# Patient Record
Sex: Male | Born: 1970 | Race: White | Hispanic: No | Marital: Married | Smoking: Never smoker
Health system: Southern US, Community
[De-identification: ages and names within clinical notes are randomized; demographics above are authoritative.]

## PROBLEM LIST (undated history)

## (undated) DIAGNOSIS — Z9109 Other allergy status, other than to drugs and biological substances: Secondary | ICD-10-CM

## (undated) HISTORY — PX: TONSILLECTOMY: SUR1361

---

## 2014-10-05 ENCOUNTER — Encounter (HOSPITAL_COMMUNITY): Payer: Self-pay | Admitting: Emergency Medicine

## 2014-10-05 ENCOUNTER — Emergency Department (HOSPITAL_COMMUNITY)
Admission: EM | Admit: 2014-10-05 | Discharge: 2014-10-05 | Disposition: A | Discharge status: Home / Self Care | Payer: Worker's Compensation | Attending: Emergency Medicine | Admitting: Emergency Medicine

## 2014-10-05 DIAGNOSIS — Z575 Occupational exposure to toxic agents in other industries: Secondary | ICD-10-CM | POA: Diagnosis not present

## 2014-10-05 DIAGNOSIS — Z7729 Contact with and (suspected ) exposure to other hazardous substances: Secondary | ICD-10-CM

## 2014-10-05 HISTORY — DX: Other allergy status, other than to drugs and biological substances: Z91.09

## 2014-10-05 NOTE — ED Notes (Signed)
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 Patient only complaint at this time is nausea related to the ride to the ER. Reports hx of getting car sick.

## 2014-10-05 NOTE — ED Notes (Signed)
Bed: WA25 Expected date:  Expected time:  Means of arrival:  Comments: EMS  

## 2014-10-05 NOTE — Discharge Instructions (Signed)
Please follow the directions provided.  Be sure to follow-up with your primary care provider to ensure you are getting better.  Rest, drink plenty of fluids and slowly resume normal activities.  Don't hesitate to return for any new, worsening or concerning symptoms.    SEEK IMMEDIATE MEDICAL CARE IF:  You suspect that a person has inhaled carbon monoxide gas. Remove the person from the area immediately. Call your local emergency services (911 in [place] Begin rescue breathing if the person is unconscious.   Emergency Department Resource Guide 1) Find a Doctor and Pay Out of Pocket Although you won't have to find out who is covered by your insurance plan, it is a good idea to ask around and get recommendations. You will then need to call the office and see if the doctor you have chosen will accept you as a new patient and what types of options they offer for patients who are self-pay. Some doctors offer discounts or will set up payment plans for their patients who do not have insurance, but you will need to ask so you aren't surprised when you get to your appointment.  2) Contact Your Local Health Department Not all health departments have doctors that can see patients for sick visits, but many do, so it is worth a call to see if yours does. If you don't know where your local health department is, you can check in your phone book. The CDC also has a tool to help you locate your state's health department, and many state websites also have listings of all of their local health departments.  3) Find a Walk-in Clinic If your illness is not likely to be very severe or complicated, you may want to try a walk in clinic. These are popping up all over the country in pharmacies, drugstores, and shopping centers. They're usually staffed by nurse practitioners or physician assistants that have been trained to treat common illnesses and complaints. They're usually fairly quick and inexpensive. However, if you have  serious medical issues or chronic medical problems, these are probably not your best option.  No Primary Care Doctor: - Call Health Connect at  [phone] - they can help you locate a primary care doctor that  accepts your insurance, provides certain services, etc. - Physician Referral Service- [phone]  Chronic Pain Problems: Organization         Address  Phone   Notes  [facility]  [phone] Patients need to be referred by their primary care doctor.   Medication Assistance: Organization         Address  Phone   Notes  [facility] [address] [place], [place] [address] [phone]) [phone] --Must be a resident of [place] [facility] [place] -- Must have NO insurance coverage whatsoever (no Medicaid/ Medicare, etc.) -- The pt. MUST have a primary care doctor that directs their care regularly and follows them in the community   [facility]  [phone]   [employer]  [phone]    Agencies that provide inexpensive medical care: Organization         Address  Phone   Notes  [facility]  [phone]   [facility]    [phone]   [facility] [address] [place], [place] [address] [phone]) [phone]   [facility] [address], [place] [phone]   [facility]    [phone](336) [phone]  [facility]    (336) 272-1050   °[facility] ° [address], [place] Phone:  (336) [phone], Fax:  ([phone] Hours of Operation:  9 am - 6 pm, M-F.  Also accepts Medicaid/Medicare and self-pay.  °[facility] ° [address], [place] Phone: (336) [phone], Fax: [phone]) [phone]. Hours of Operation:  8:30 am - 5:30 pm, M-F.  Also accepts Medicaid and self-pay.  °[facility] [place] [address], [place] Phone: (336) [phone]   °[facility] [address],  [place], [place] (336)723-1848, Ext. 123 Mondays & Thursdays: 7-9 AM.  First 15 patients are seen on a first come, first serve basis. °  ° °Medicaid-accepting [place] Providers: ° °Organization         Address  Phone   Notes  °[facility] [address], [place] ([phone]) [phone] Also accepts self-pay patients.  °[facility] [address], [place] ° (336) [phone]   °[facility] [address], [place] (336) [phone]   °[facility] [address] [place] [address], [place] (336) 299-7000   °Veita Bland [address], [place]  ° ([phone]) [phone] Only accepts [place] Access Medicaid patients after they have their name applied to their card.  ° °Self-Pay (no insurance) in [place]: ° °Organization         Address  Phone   Notes  °Sickle Cell Patients, [facility] [address], [place] (336) 832-1970   °[facility] [address], [place] (336) 832-4400   °[facility] ° [address], [place] (336) [phone]   °[facility]/Dr. Osei-Bonsu ° [address] [place] [address], [place] or [address], [place] ([phone]) [phone] Phone number for both [place] and [place] locations is the same.  °[facility] [address], [place] (336) [phone]   °[facility] [address] [place] [address], [place] or [address] (336) 852-7530 °(336) 878-2260   °[facility] [address],  (336) 350-1642, phone; (336) [phone], fax Sees patients 1st and 3rd Saturday of every month.  Must not qualify for public or private insurance (i.e. Medicaid, Medicare, [place] Health Choice, Veterans' Benefits) • Household income should be no more than 200% of the poverty level •The clinic cannot treat you if you are pregnant or think you are pregnant •  Sexually transmitted diseases are not treated at the clinic.  ° ° °Dental Care: °Organization         Address  Phone  Notes  °[facility] [address],  (336) [phone] Accepts children up to age 21 who are enrolled in Medicaid or [place] Health Choice; pregnant women with a Medicaid card; and children who have applied for Medicaid or [place] Health Choice, but were declined, whose parents can pay a reduced fee at time of service.  °[facility] [place]  [address], [place] (336) 641-7733 Accepts children up to age 21 who are enrolled in Medicaid or [place] Health Choice; pregnant women with a Medicaid card; and children who have applied for Medicaid or [place] Health Choice, but were declined, whose parents can pay a reduced fee at time   of service.  °[facility] ° [address], [place] (336) 641-4533 Patients are seen by appointment only. Walk-ins are not accepted. [facility] will see patients 18 years of age and older. °Monday - Tuesday (8am-5pm) °Most Wednesdays (8:30-5pm) °$30 per visit, cash only  °[facility] ° [address], [place] (336) [phone] Patients are seen by appointment only. Walk-ins are not accepted. [facility] will see patients 18 years of age and older. °One Wednesday Evening (Monthly: Volunteer Based).  $30 per visit, cash only  °[facility]  (919) [phone] for adults; Children under age 4, call Graduate Pediatric Dentistry at ([phone]) [phone]. Children aged 4-14, please call [phone]) [phone] to request a pediatric application. ° Dental services are provided in all areas of dental care including fillings, crowns and bridges, complete and partial dentures, implants, gum treatment, root canals, and extractions. Preventive care is also provided. Treatment is provided to both adults and children. °Patients  are selected via a lottery and there is often a waiting list. °  °[facility] [address], °[place] ° (336) 763-8833 [email or web address] °  °[facility] [address], [place], [place] (336)723-1848, Ext. 123 Second and Fourth Thursday of each month, opens at 6:30 AM; Clinic ends at 9 AM.  Patients are seen on a first-come first-served basis, and a limited number are seen during each clinic.  ° °[facility] ° [address], [place], [place] (336) 723-7904   Eligibility Requirements °You must have lived in [place], [place], or [place] counties for at least the last three months. °  You cannot be eligible for state or federal sponsored healthcare insurance, including Veterans Administration, Medicaid, or Medicare. °  You generally cannot be eligible for healthcare insurance through your employer.  °  How to apply: °Eligibility screenings are held every Tuesday and Wednesday afternoon from 1:00 pm until 4:00 pm. You do not need an appointment for the interview!  °[facility] [address], [place], [place] 336-631-2330   °[facility]  336-342-8273   °[facility]  336-703-3100   °[facility]  336-570-6415   ° °Behavioral Health Resources in the Community: °Intensive Outpatient Programs °Organization         Address  Phone  Notes  °[facility] [address], [place], [place] 336-878-6098   °[facility] [address], [place], [place] 336-832-9800   °[facility] [address], [place], [place] ° 336-882-2125   °[facility] [address],  °[place], [place] 1-800-853-5163 or 336-641-4981   °Substance Abuse Resources °Organization         Address  Phone  Notes  °[facility]  336-882-2125   °[facility]  336-784-9470   °[facility]  336-285-9073   °[facility]  336-845-3988    °Residential & Outpatient Substance Abuse Program  1-800-659-3381   °Psychological Services °Organization         Address  Phone  Notes  °[facility]  336- 832-9600   °[facility]  336- 378-7881   °[facility] [address], [place] 1-800-853-5163 or 336-641-4981   ° °Mobile Crisis Teams °Organization         Address  Phone  Notes  °[facility], Mobile Crisis Care Unit  1-877-626-1772   °Assertive °Psychotherapeutic   Services ° [address] [place], [place] 336-834-9664   °Sharon DeEsch [address] °[place] 336-554-5454   ° °Self-Help/Support Groups °Organization         Address  Phone             Notes  °[facility] [place] [facility] - variety of support groups  336- 373-1402 Call for more information  °Narcotics Anonymous (NA), [facility] [address], °[place]  2 meetings at this location  ° °Residential Treatment Programs °Organization         Address  Phone  Notes  °[facility] [address],    °[place]  1-866-801-8205   °[facility] ° [address], [place], [place] 704-293-8524   °[facility] [address], [place] 336-845-3988 Admissions: 8am-3pm M-F  °[facility] [address]    °[place], [place] 336-841-1104   °[facility] [address], [place], [place] 336-379-7146   °[facility] [address]  °[place], [place] 336-285-9073   °[facility] - Intensive Outpatient [address], [place], [place] 336-852-3033   °[facility] [address]  °[place], [place] 1-877-615-2722 or 336-784-9470   °[facility] ([facility]) [address] [place], [place] 336-227-7417 Accepts Medicaid  °[facility] [address]  ° [place] 1-800-659-3381 Substance Abuse/Addiction Treatment  ° °[place] [facility]  Resources °Organization         Address  Phone  Notes  °[facility]  (888) 581-9988   °Julie Brannon, PhD [address] [place], [place]   (336) 349-5553 or (336) 951-0000   °[facility]   [address] °[place], [place] (336) 349-4454   °[facility] [address], [place], [place] (336) 342-8316 Insurance/Medicaid/sponsorship through [facility]  °[facility] [address]                                    [place], [place] (336) 342-8316 Therapy/tele-psych/case  °[facility] [address]  ° [place], [place] (336) 349-2233    °Dr. Arfeen  (336) 349-4544   °[facility] [place]  [employer] [place] [facility] 1) [address], [place] °2) [address], [place] °3)  [address], [place] (336) 349-3220 °(336) 342-7768 ° °(336) 342-8140   °[place] Child Abuse [facility] (336) 342-1394 or (336) 342-3537 (After Hours)    ° ° ° °

## 2014-10-05 NOTE — ED Notes (Signed)
Per EMS, [place] airport[place] Another co worker started feeling sick, which made other aware of the exposure. Patient is asymptomatic, no complaints. Patient CO2 initially 3, now 0 after being placed on NRB while en route.

## 2014-10-05 NOTE — ED Provider Notes (Signed)
CSN: [id number]     Arrival date & time 10/05/14  0423 History   First MD Initiated Contact with Patient 10/05/14 0423     Chief Complaint  Patient presents with  . natural gas exposure    (Consider location/radiation/quality/duration/timing/severity/associated sxs/prior Treatment) HPI  Russell Berg is a 44 yo male male presenting with report of exposure to carbon monoxide. [place] and states appr 3 hours PTA he heard alarms going off but never had any symptoms. He was told that the carbon monoxide monitors were alarming and everyone was evacuated. He was brought in to be checked but never had any symptoms and feels at his baseline. He denies any headache, vomiting, shortness of breath, syncope or visual changes.   Past Medical History  Diagnosis Date  . Environmental allergies    Past Surgical History  Procedure Laterality Date  . Tonsillectomy     History reviewed. No pertinent family history. History  Substance Use Topics  . Smoking status: Never Smoker   . Smokeless tobacco: Not on file  . Alcohol Use: Yes     Comment: social    Review of Systems  Constitutional: Negative for fever and chills.  HENT: Negative for sore throat.   Eyes: Negative for visual disturbance.  Respiratory: Negative for cough and shortness of breath.   Cardiovascular: Negative for chest pain and leg swelling.  Gastrointestinal: Negative for nausea, vomiting and diarrhea.  Genitourinary: Negative for dysuria.  Musculoskeletal: Negative for myalgias.  Skin: Negative for rash.  Neurological: Negative for weakness, numbness and headaches.      Allergies  Review of patient's allergies indicates no known allergies.  Home Medications   Prior to Admission medications   Not on File   BP 131/90 mmHg  Pulse 70  Temp(Src) 97.7 F (36.5 C) (Oral)  Resp 16  SpO2 98% Physical Exam  Constitutional: He is oriented to person, place, and time. He appears well-developed and  well-nourished. No distress.  HENT:  Head: Normocephalic and atraumatic.  Mouth/Throat: Oropharynx is clear and moist.  Eyes: Conjunctivae are normal. Right eye exhibits no discharge. Left eye exhibits no discharge. No scleral icterus.  Neck: Normal range of motion.  Cardiovascular: Normal rate, regular rhythm and intact distal pulses.   Pulmonary/Chest: Effort normal and breath sounds normal. No respiratory distress. He has no wheezes. He has no rales. He exhibits no tenderness.  Abdominal: Soft. There is no tenderness.  Musculoskeletal: He exhibits no tenderness.  Lymphadenopathy:    He has no cervical adenopathy.  Neurological: He is alert and oriented to person, place, and time. No cranial nerve deficit. Coordination normal.  Skin: Skin is warm and dry. No rash noted. He is not diaphoretic.  Psychiatric: He has a normal mood and affect.  Nursing note and vitals reviewed.   ED Course  Procedures (including critical care time) Labs Review Labs Reviewed - No data to display  Imaging Review No results found.   EKG Interpretation None      MDM   Final diagnoses:  Carbon monoxide exposure   44 yo with exposure to carbon monoxide with no symptoms at anytime.  His highest CO reading en route was 3 with the most recent reading was 0 after O2 administration.  Discussed case with Dr. Ranae PalmsYelverton. Pt is well-appearing, in no acute distress and vital signs reviewed and not concerning. He appears safe to be discharged.  Discharge include follow-up with their PCP.  Return precautions provided.  Pt aware of plan  and in agreement.    Filed Vitals:   10/05/14 0423 10/05/14 0426  BP:  131/90  Pulse:  70  Temp:  97.7 F (36.5 C)  TempSrc:  Oral  Resp:  16  SpO2: 100% 98%    Meds given in ED:  Medications - No data to display  New Prescriptions   No medications on file       Harle BattiestElizabeth Annissa Andreoni, NP 10/05/14 1738  Loren Raceravid Yelverton, MD 10/06/14 [phone]

## 2015-02-22 ENCOUNTER — Encounter (HOSPITAL_COMMUNITY): Payer: Self-pay | Admitting: *Deleted

## 2015-02-22 ENCOUNTER — Emergency Department (HOSPITAL_COMMUNITY)
Admission: EM | Admit: 2015-02-22 | Discharge: 2015-02-22 | Disposition: A | Discharge status: Home / Self Care | Payer: Managed Care, Other (non HMO) | Attending: Emergency Medicine | Admitting: Emergency Medicine

## 2015-02-22 ENCOUNTER — Emergency Department (HOSPITAL_COMMUNITY): Payer: Managed Care, Other (non HMO)

## 2015-02-22 DIAGNOSIS — Z79899 Other long term (current) drug therapy: Secondary | ICD-10-CM | POA: Diagnosis not present

## 2015-02-22 DIAGNOSIS — R110 Nausea: Secondary | ICD-10-CM | POA: Insufficient documentation

## 2015-02-22 DIAGNOSIS — Z791 Long term (current) use of non-steroidal anti-inflammatories (NSAID): Secondary | ICD-10-CM | POA: Insufficient documentation

## 2015-02-22 DIAGNOSIS — R42 Dizziness and giddiness: Secondary | ICD-10-CM | POA: Insufficient documentation

## 2015-02-22 DIAGNOSIS — R51 Headache: Secondary | ICD-10-CM | POA: Insufficient documentation

## 2015-02-22 LAB — BASIC METABOLIC PANEL
Anion gap: 7 (ref 5–15)
BUN: 13 mg/dL (ref 6–20)
CALCIUM: 8.9 mg/dL (ref 8.9–10.3)
CO2: 25 mmol/L (ref 22–32)
CREATININE: 1.02 mg/dL (ref 0.61–1.24)
Chloride: 104 mmol/L (ref 101–111)
GFR calc Af Amer: >60 mL/min (ref >60)
GFR calc non Af Amer: >60 mL/min (ref >60)
GLUCOSE: 164 mg/dL — AB (ref 65–99)
Potassium: 4 mmol/L (ref 3.5–5.1)
Sodium: 136 mmol/L (ref 135–145)

## 2015-02-22 LAB — CBC WITH DIFFERENTIAL/PLATELET
Basophils Absolute: 0 10*3/uL (ref 0.0–0.1)
Basophils Relative: 0 %
EOS PCT: 0 %
Eosinophils Absolute: 0.1 10*3/uL (ref 0.0–0.7)
HEMATOCRIT: 41.8 % (ref 39.0–52.0)
Hemoglobin: 14.3 g/dL (ref 13.0–17.0)
LYMPHS PCT: 8 %
Lymphs Abs: 1.3 10*3/uL (ref 0.7–4.0)
MCH: 30.3 pg (ref 26.0–34.0)
MCHC: 34.2 g/dL (ref 30.0–36.0)
MCV: 88.6 fL (ref 78.0–100.0)
MONO ABS: 0.7 10*3/uL (ref 0.1–1.0)
MONOS PCT: 5 %
NEUTROS ABS: 13.5 10*3/uL — AB (ref 1.7–7.7)
Neutrophils Relative %: 87 %
PLATELETS: 251 10*3/uL (ref 150–400)
RBC: 4.72 MIL/uL (ref 4.22–5.81)
RDW: 12.2 % (ref 11.5–15.5)
WBC: 15.6 10*3/uL — ABNORMAL HIGH (ref 4.0–10.5)

## 2015-02-22 MED ORDER — ONDANSETRON 4 MG PO TBDP
4.0000 mg | ORAL_TABLET | Freq: Once | ORAL | Status: AC
  Administered 2015-02-22: 4 mg via ORAL
  Filled 2015-02-22: qty 1

## 2015-02-22 MED ORDER — MECLIZINE HCL 25 MG PO TABS: 25.0000 mg | ORAL_TABLET | Freq: Three times a day (TID) | ORAL | Status: AC | PRN

## 2015-02-22 MED ORDER — MECLIZINE HCL 25 MG PO TABS
25.0000 mg | ORAL_TABLET | Freq: Once | ORAL | Status: AC
  Administered 2015-02-22: 25 mg via ORAL
  Filled 2015-02-22: qty 1

## 2015-02-22 MED ORDER — PROMETHAZINE HCL 25 MG PO TABS: 25.0000 mg | ORAL_TABLET | Freq: Four times a day (QID) | ORAL | Status: AC | PRN

## 2015-02-22 NOTE — ED Notes (Signed)
Called CT for an update, since it's been about an hour since teasts were ordered. Stated that they would try to fit him in within the next few minutes.  

## 2015-02-22 NOTE — ED Notes (Signed)
Pt to ED via [facility] c/o dizziness associated with nausea. Dizziness worsens with head movements.  VS bp 113/82; pulse 64; 95%

## 2015-02-22 NOTE — Discharge Instructions (Signed)
Benign Positional Vertigo °Vertigo is the feeling that you or your surroundings are moving when they are not. Benign positional vertigo is the most common form of vertigo. The cause of this condition is not serious (is benign). This condition is triggered by certain movements and positions (is positional). This condition can be dangerous if it occurs while you are doing something that could endanger you or others, such as driving.  °CAUSES °In many cases, the cause of this condition is not known. It may be caused by a disturbance in an area of the inner ear that helps your brain to sense movement and balance. This disturbance can be caused by a viral infection (labyrinthitis), head injury, or repetitive motion. °RISK FACTORS °This condition is more likely to develop in: °· Women. °· People who are 50 years of age or older. °SYMPTOMS °Symptoms of this condition usually happen when you move your head or your eyes in different directions. Symptoms may start suddenly, and they usually last for less than a minute. Symptoms may include: °· Loss of balance and falling. °· Feeling like you are spinning or moving. °· Feeling like your surroundings are spinning or moving. °· Nausea and vomiting. °· Blurred vision. °· Dizziness. °· Involuntary eye movement (nystagmus). °Symptoms can be mild and cause only slight annoyance, or they can be severe and interfere with daily life. Episodes of benign positional vertigo may return (recur) over time, and they may be triggered by certain movements. Symptoms may improve over time. °DIAGNOSIS °This condition is usually diagnosed by medical history and a physical exam of the head, neck, and ears. You may be referred to a health care provider who specializes in ear, nose, and throat (ENT) problems (otolaryngologist) or a provider who specializes in disorders of the nervous system (neurologist). You may have additional testing, including: °· MRI. °· A CT scan. °· Eye movement tests. Your  health care provider may ask you to change positions quickly while he or she watches you for symptoms of benign positional vertigo, such as nystagmus. Eye movement may be tested with an electronystagmogram (ENG), caloric stimulation, the Dix-Hallpike test, or the roll test. °· An electroencephalogram (EEG). This records electrical activity in your brain. °· Hearing tests. °TREATMENT °Usually, your health care provider will treat this by moving your head in specific positions to adjust your inner ear back to normal. Surgery may be needed in severe cases, but this is rare. In some cases, benign positional vertigo may resolve on its own in 2-4 weeks. °HOME CARE INSTRUCTIONS °Safety °· Move slowly. Avoid sudden body or head movements. °· Avoid driving. °· Avoid operating heavy machinery. °· Avoid doing any tasks that would be dangerous to you or others if a vertigo episode would occur. °· If you have trouble walking or keeping your balance, try using a cane for stability. If you feel dizzy or unstable, sit down right away. °· Return to your normal activities as told by your health care provider. Ask your health care provider what activities are safe for you. °General Instructions °· Take over-the-counter and prescription medicines only as told by your health care provider. °· Avoid certain positions or movements as told by your health care provider. °· Drink enough fluid to keep your urine clear or pale yellow. °· Keep all follow-up visits as told by your health care provider. This is important. °SEEK MEDICAL CARE IF: °· You have a fever. °· Your condition gets worse or you develop new symptoms. °· Your family or friends   notice any behavioral changes. °· Your nausea or vomiting gets worse. °· You have numbness or a "pins and needles" sensation. °SEEK IMMEDIATE MEDICAL CARE IF: °· You have difficulty speaking or moving. °· You are always dizzy. °· You faint. °· You develop severe headaches. °· You have weakness in your  legs or arms. °· You have changes in your hearing or vision. °· You develop a stiff neck. °· You develop sensitivity to light. °  °This information is not intended to replace advice given to you by your health care provider. Make sure you discuss any questions you have with your health care provider. °  °Document Released: 02/10/2006 Document Revised: 01/24/2015 Document Reviewed: 08/28/2014 °Elsevier Interactive Patient Education ©2016 [employer] ° °Dizziness °Dizziness is a common problem. It makes you feel unsteady or lightheaded. You may feel like you are about to pass out (faint). Dizziness can lead to injury if you stumble or fall. Anyone can get dizzy, but dizziness is more common in older adults. This condition can be caused by a number of things, including: °· Medicines. °· Dehydration. °· Illness. °HOME CARE °Following these instructions may help with your condition: °Eating and Drinking °· Drink enough fluid to keep your pee (urine) clear or pale yellow. This helps to keep you from getting dehydrated. Try to drink more clear fluids, such as water. °· Do not drink alcohol. °· Limit how much caffeine you drink or eat if told by your doctor. °· Limit how much salt you drink or eat if told by your doctor. °Activity °· Avoid making quick movements. °¨ When you stand up from sitting in a chair, steady yourself until you feel okay. °¨ In the morning, first sit up on the side of the bed. When you feel okay, stand slowly while you hold onto something. Do this until you know that your balance is fine. °· Move your legs often if you need to stand in one place for a long time. Tighten and relax your muscles in your legs while you are standing. °· Do not drive or use heavy machinery if you feel dizzy. °· Avoid bending down if you feel dizzy. Place items in your home so that they are easy for you to reach without leaning over. °Lifestyle °· Do not use any tobacco products, including cigarettes, chewing tobacco, or  electronic cigarettes. If you need help quitting, ask your doctor. °· Try to lower your stress level, such as with yoga or meditation. Talk with your doctor if you need help. °General Instructions °· Watch your dizziness for any changes. °· Take medicines only as told by your doctor. Talk with your doctor if you think that your dizziness is caused by a medicine that you are taking. °· Tell a friend or a family member that you are feeling dizzy. If he or she notices any changes in your behavior, have this person call your doctor. °· Keep all follow-up visits as told by your doctor. This is important. °GET HELP IF: °· Your dizziness does not go away. °· Your dizziness or light-headedness gets worse. °· You feel sick to your stomach (nauseous). °· You have trouble hearing. °· You have new symptoms. °· You are unsteady on your feet or you feel like the room is spinning. °GET HELP RIGHT AWAY IF: °· You throw up (vomit) or have diarrhea and are unable to eat or drink anything. °· You have trouble: °¨ Talking. °¨ Walking. °¨ Swallowing. °¨ Using your arms, hands, or legs. °·   You feel generally weak.  You are not thinking clearly or you have trouble forming sentences. It may take a friend or family member to notice this.  You have:  Chest pain.  Pain in your belly (abdomen).  Shortness of breath.  Sweating.  Your vision changes.  You are bleeding.  You have a headache.  You have neck pain or a stiff neck.  You have a fever.   This information is not intended to replace advice given to you by your health care provider. Make sure you discuss any questions you have with your health care provider.   Document Released: 04/24/2011 Document Revised: 09/19/2014 Document Reviewed: 05/01/2014 Elsevier Interactive Patient Education [employer]  Medications as directed. Phenergan is for nausea and vomiting. The anti-Burtis for the dizziness. Follow-up if it does not resolve over the next several  days. Work note provided. Return for any new or worse symptoms.

## 2015-02-22 NOTE — ED Notes (Signed)
Pt c/o onset of dizziness tonight. Reports having seasonal allergies in which pt takes allegra; pt reports not taking allegra in over a week. Dizziness worsens with movement. No neuro deficits

## 2015-02-22 NOTE — ED Notes (Signed)
MD at bedside. 

## 2015-02-22 NOTE — ED Notes (Signed)
Called CT for an update, since it's been about an hour since teasts were ordered. Stated that they would try to fit him in within the next few minutes.

## 2015-02-22 NOTE — ED Notes (Signed)
Pt transported to CT ?

## 2015-02-22 NOTE — ED Provider Notes (Signed)
CSN: [id number]     Arrival date & time 02/22/15  0559 History   First MD Initiated Contact with Patient 02/22/15 [phone]     Chief Complaint  Patient presents with  . Dizziness     (Consider location/radiation/quality/duration/timing/severity/associated sxs/prior Treatment) Patient is a 44 y.o. male presenting with dizziness. The history is provided by the patient.  Dizziness Associated symptoms: nausea   Associated symptoms: no shortness of breath    patient with acute onset of dizziness and vertigo with for new morning. Made worse with movement of his head. Patient states that he has a history of seasonal allergies and sometimes congestion is caused room spinning in the past. Patient felt fine all day yesterday and when he went to bed. Patient denies any visual changes any focal deficits. Any significant headache. Associated with nausea but no vomiting.  Past Medical History  Diagnosis Date  . Environmental allergies    Past Surgical History  Procedure Laterality Date  . Tonsillectomy     History reviewed. No pertinent family history. Social History  Substance Use Topics  . Smoking status: Never Smoker   . Smokeless tobacco: None  . Alcohol Use: Yes     Comment: social    Review of Systems  Constitutional: Negative for fever.  HENT: Negative for congestion.   Eyes: Negative for visual disturbance.  Respiratory: Negative for shortness of breath.   Gastrointestinal: Positive for nausea.  Genitourinary: Negative for dysuria.  Musculoskeletal: Negative for back pain and neck pain.  Neurological: Positive for dizziness.  Hematological: Does not bruise/bleed easily.  Psychiatric/Behavioral: Negative for confusion.      Allergies  Review of patient's allergies indicates no known allergies.  Home Medications   Prior to Admission medications   Medication Sig Start Date End Date Taking? Authorizing Provider  fexofenadine-pseudoephedrine (ALLEGRA-D) 60-120 MG 12 hr tablet  Take 1 tablet by mouth 2 (two) times daily.   Yes Historical Provider, MD  GARCINIA CAMBOGIA-CHROMIUM PO Take 1 capsule by mouth 2 (two) times daily.   Yes Historical Provider, MD  Multiple Vitamin (MULTIVITAMIN WITH MINERALS) TABS tablet Take 1 tablet by mouth daily.   Yes Historical Provider, MD  naproxen sodium (ANAPROX) 220 MG tablet Take 440 mg by mouth every morning.   Yes Historical Provider, MD  meclizine (ANTIVERT) 25 MG tablet Take 1 tablet (25 mg total) by mouth 3 (three) times daily as needed for dizziness. 02/22/15   Vanetta Mulders, MD  promethazine (PHENERGAN) 25 MG tablet Take 1 tablet (25 mg total) by mouth every 6 (six) hours as needed for nausea or vomiting. 02/22/15   Vanetta Mulders, MD   BP 116/85 mmHg  Pulse 73  Temp(Src) 97.4 F (36.3 C) (Oral)  Resp 19  SpO2 98% Physical Exam  Constitutional: He is oriented to person, place, and time. He appears well-developed and well-nourished. No distress.  HENT:  Head: Normocephalic and atraumatic.  Mouth/Throat: Oropharynx is clear and moist.  Eyes: Conjunctivae and EOM are normal. Pupils are equal, round, and reactive to light.  Neck: Normal range of motion.  Cardiovascular: Normal rate, regular rhythm and normal heart sounds.   Pulmonary/Chest: Effort normal and breath sounds normal. No respiratory distress.  Abdominal: Soft. Bowel sounds are normal. There is no tenderness.  Musculoskeletal: Normal range of motion.  Neurological: He is alert and oriented to person, place, and time. No cranial nerve deficit. He exhibits normal muscle tone. Coordination normal.  Skin: Skin is warm. No rash noted.  Nursing note and vitals  reviewed.   ED Course  Procedures (including critical care time) Labs Review Labs Reviewed  CBC WITH DIFFERENTIAL/PLATELET - Abnormal; Notable for the following:    WBC 15.6 (*)    Neutro Abs 13.5 (*)    All other components within normal limits  BASIC METABOLIC PANEL - Abnormal; Notable for the  following:    Glucose, Bld 164 (*)    All other components within normal limits   Results for orders placed or performed during the hospital encounter of 02/22/15  CBC with Differential/Platelet  Result Value Ref Range   WBC 15.6 (H) 4.0 - 10.5 K/uL   RBC 4.72 4.22 - 5.81 MIL/uL   Hemoglobin 14.3 13.0 - 17.0 g/dL   HCT 16.1 09.6 - 04.5 %   MCV 88.6 78.0 - 100.0 fL   MCH 30.3 26.0 - 34.0 pg   MCHC 34.2 30.0 - 36.0 g/dL   RDW 40.9 81.1 - 91.4 %   Platelets 251 150 - 400 K/uL   Neutrophils Relative % 87 %   Neutro Abs 13.5 (H) 1.7 - 7.7 K/uL   Lymphocytes Relative 8 %   Lymphs Abs 1.3 0.7 - 4.0 K/uL   Monocytes Relative 5 %   Monocytes Absolute 0.7 0.1 - 1.0 K/uL   Eosinophils Relative 0 %   Eosinophils Absolute 0.1 0.0 - 0.7 K/uL   Basophils Relative 0 %   Basophils Absolute 0.0 0.0 - 0.1 K/uL  Basic metabolic panel  Result Value Ref Range   Sodium 136 135 - 145 mmol/L   Potassium 4.0 3.5 - 5.1 mmol/L   Chloride 104 101 - 111 mmol/L   CO2 25 22 - 32 mmol/L   Glucose, Bld 164 (H) 65 - 99 mg/dL   BUN 13 6 - 20 mg/dL   Creatinine, Ser 7.82 0.61 - 1.24 mg/dL   Calcium 8.9 8.9 - 95.6 mg/dL   GFR calc non Af Amer >60 >60 mL/min   GFR calc Af Amer >60 >60 mL/min   Anion gap 7 5 - 15     Imaging Review Ct Head Wo Contrast  02/22/2015   CLINICAL DATA:  Dizziness  EXAM: CT HEAD WITHOUT CONTRAST  TECHNIQUE: Contiguous axial images were obtained from the base of the skull through the vertex without intravenous contrast.  COMPARISON:  None.  FINDINGS: The ventricles are normal in size and configuration. There is no intracranial mass, hemorrhage, extra-axial fluid collection, or midline shift. The gray-white compartments are normal. No acute infarct evident. Bony calvarium appears intact. The mastoid air cells are clear.  IMPRESSION: Study within normal limits.   Electronically Signed   By: Bretta Bang III M.D.   On: 02/22/2015 10:36   I have personally reviewed and evaluated  these images and lab results as part of my medical decision-making.   EKG Interpretation   Date/Time:  Thursday February 22 2015 08:18:01 EDT Ventricular Rate:  59 PR Interval:  173 QRS Duration: 101 QT Interval:  414 QTC Calculation: 410 R Axis:   29 Text Interpretation:  Sinus rhythm RSR' in V1 or V2, right VCD or RVH No  previous ECGs available Confirmed by Matas Burrows  MD, Mahogony Gilchrest [id number]) on  02/22/2015 8:20:32 AM      MDM   Final diagnoses:  Vertigo    Patient with acute onset of vertigo symptoms of foreign the morning. Has had similar symptoms related to sinus problems in the past. Patient without any other focal neuro deficits. Do not feel that this is stroke  related. Patient received Antivert here with significant improvement. Head CT negative. MRI required if symptoms persist.  We'll continue treatment with the Antivert.    Vanetta Mulders, MD 02/22/15 1053

## 2016-07-15 IMAGING — CT CT HEAD W/O CM
2 series · 16 of 30 positions shown, 20 images · non-contrast
Comparison: None.

CLINICAL DATA: Dizziness

EXAM:
CT HEAD WITHOUT CONTRAST
TECHNIQUE: Contiguous axial images were obtained from the base of the skull
through the vertex without intravenous contrast.

[Series 201: head w/o, idose (1) · axial · non-contrast · 0.49mm/px · z∈[+1212,+1347]mm · 13 of 33 slices shown, 17 images]
[im 3/33  brain]
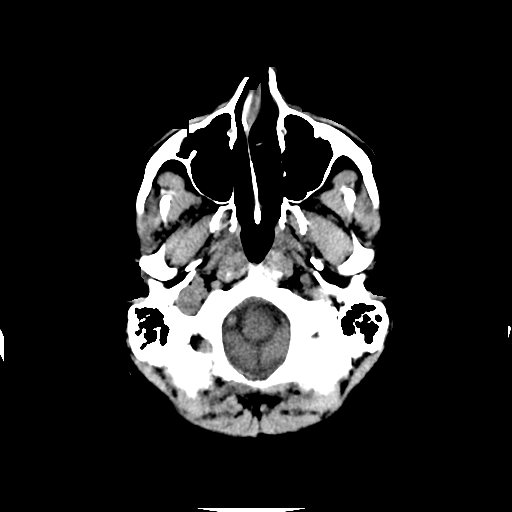
[im 3/33  bone]
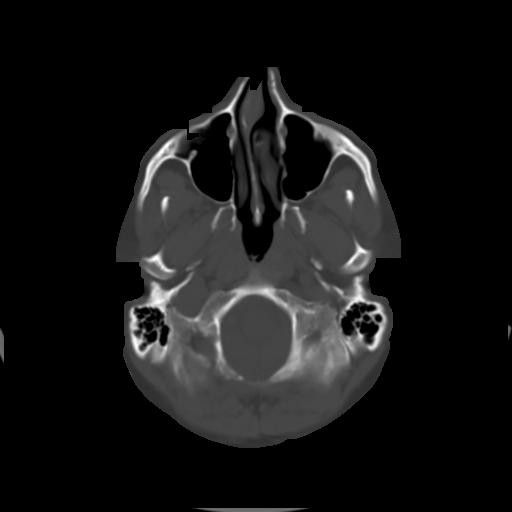
[im 5/33  brain]
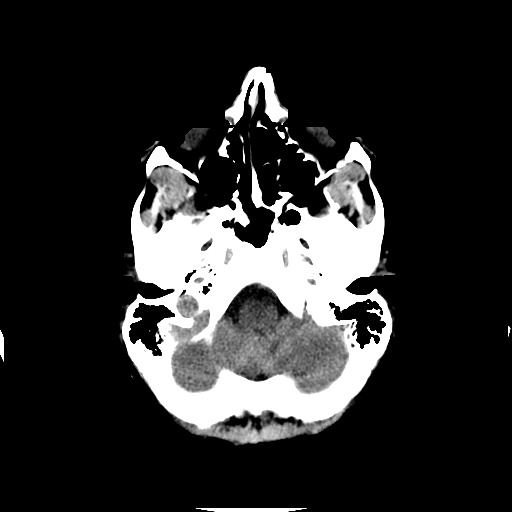
[im 7/33  brain]
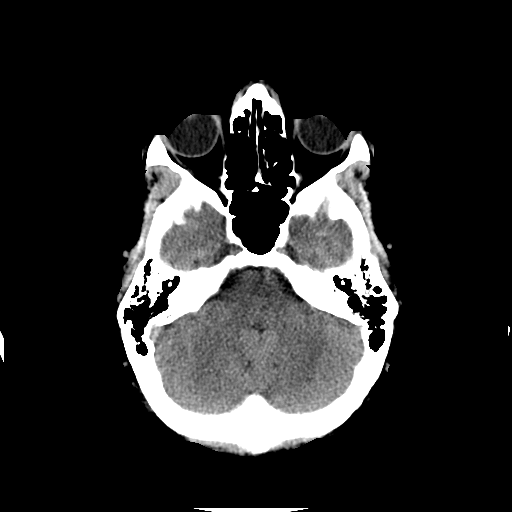
[im 10/33  brain]
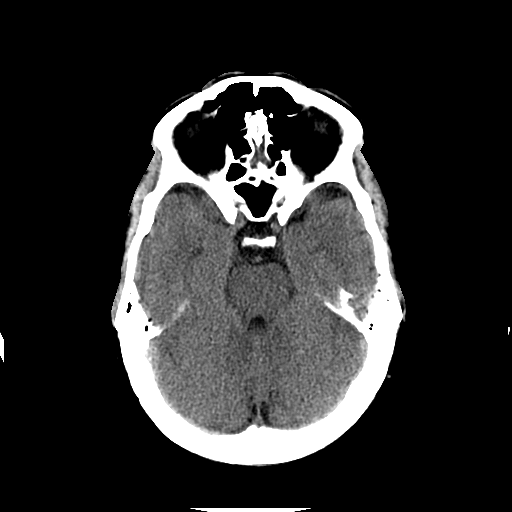
[im 12/33  brain]
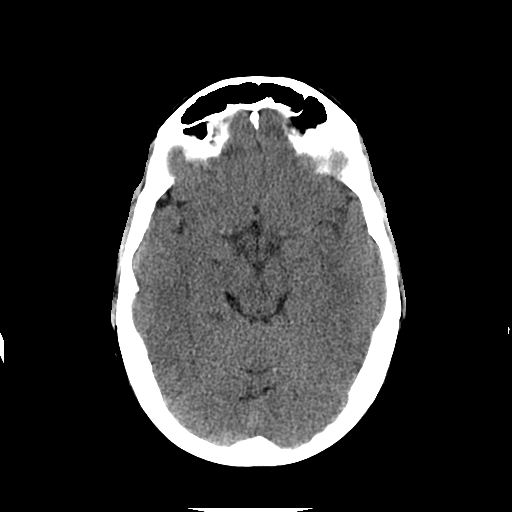
[im 12/33  bone]
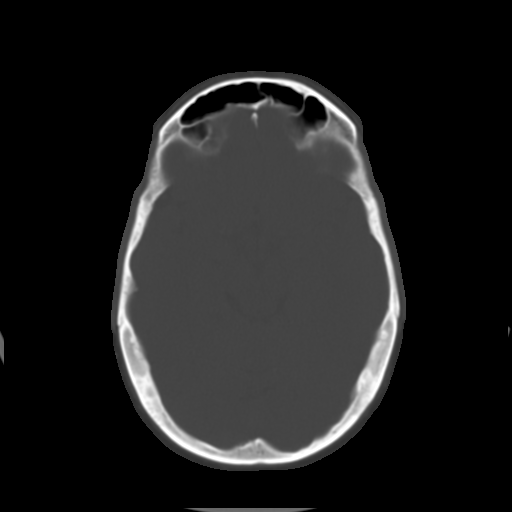
[im 14/33  brain]
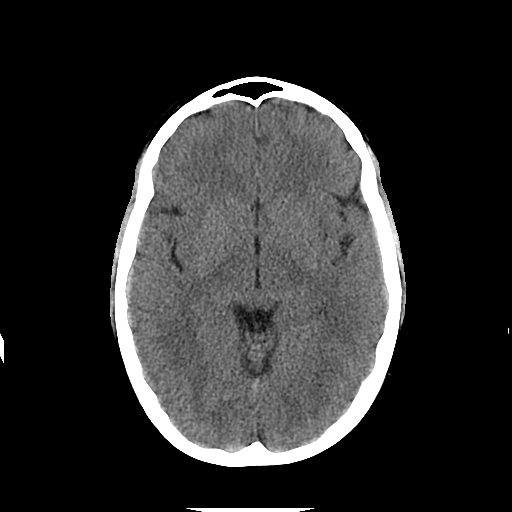
[im 17/33  brain]
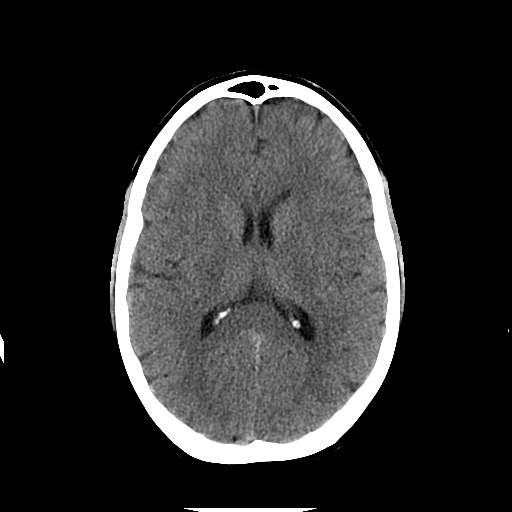
[im 19/33  brain]
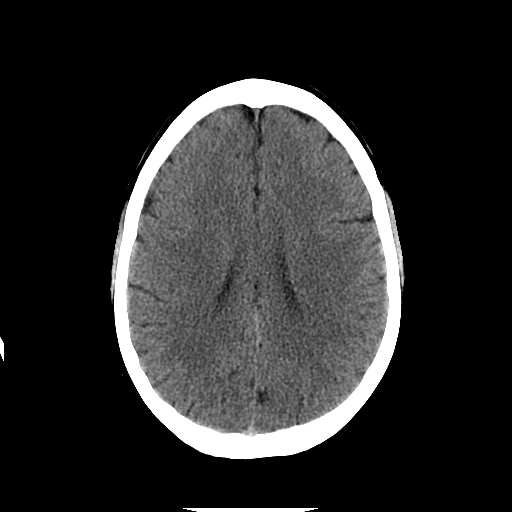
[im 21/33  brain]
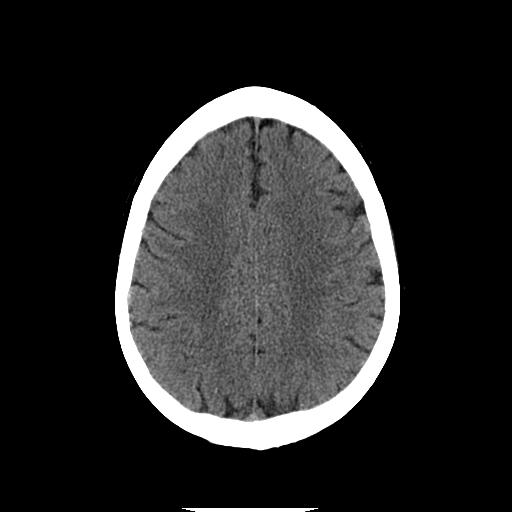
[im 21/33  bone]
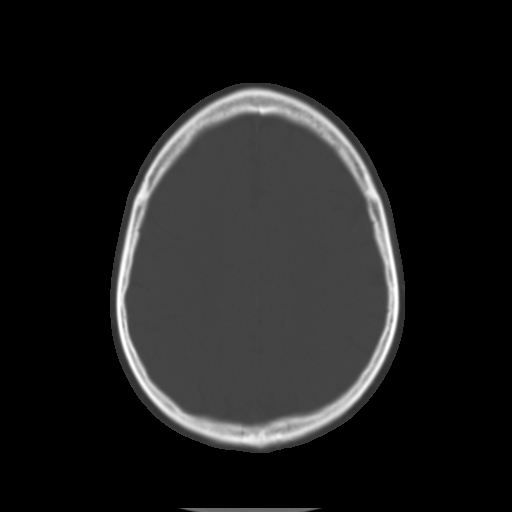
[im 23/33  brain]
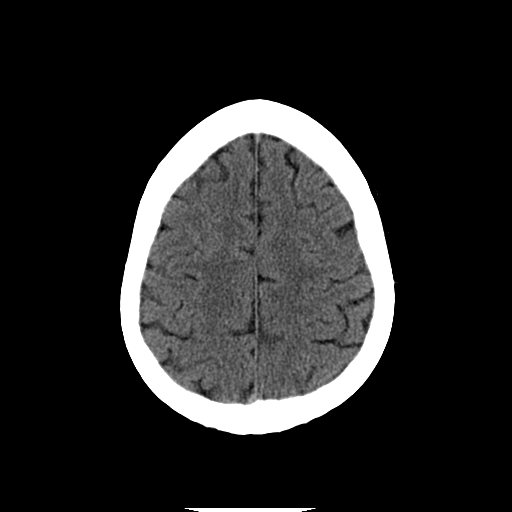
[im 26/33  brain]
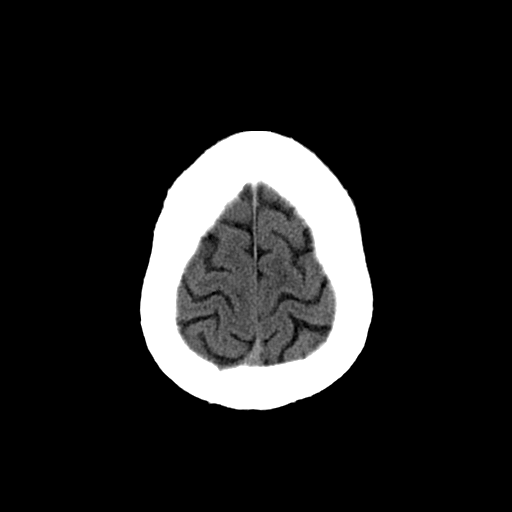
[im 28/33  brain]
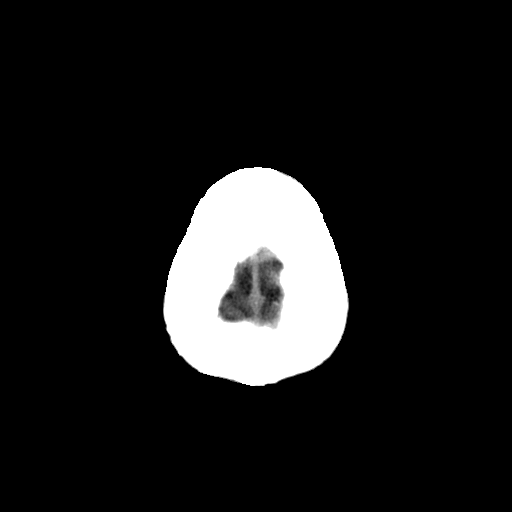
[im 30/33  brain]
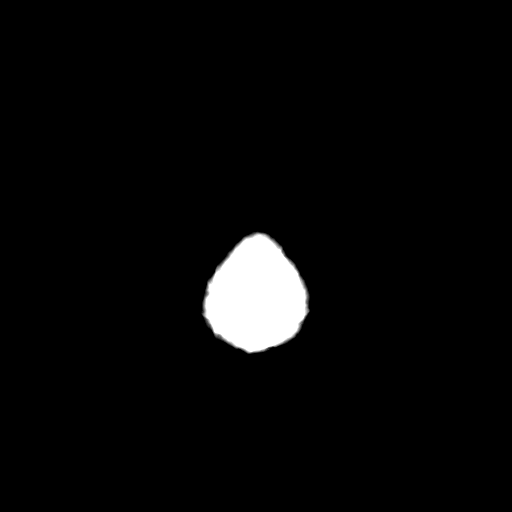
[im 30/33  bone]
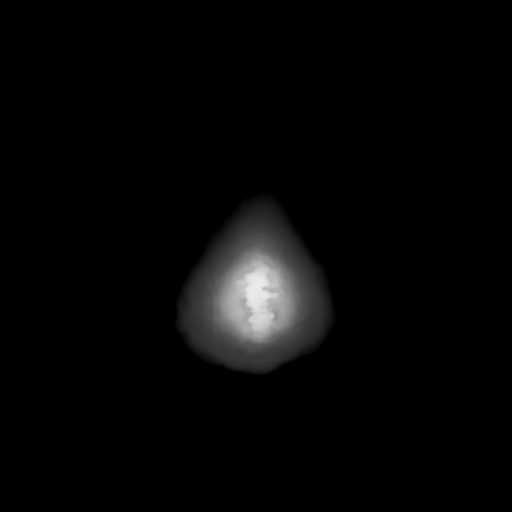

[Series 202: head w/o bone, idose (1) · axial · non-contrast · 0.49mm/px · z∈[+1212,+1257]mm · 3 of 33 slices shown]
[im 3/33  bone]
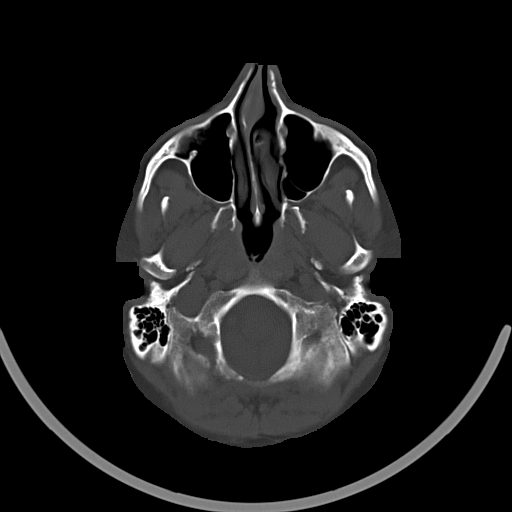
[im 7/33  bone]
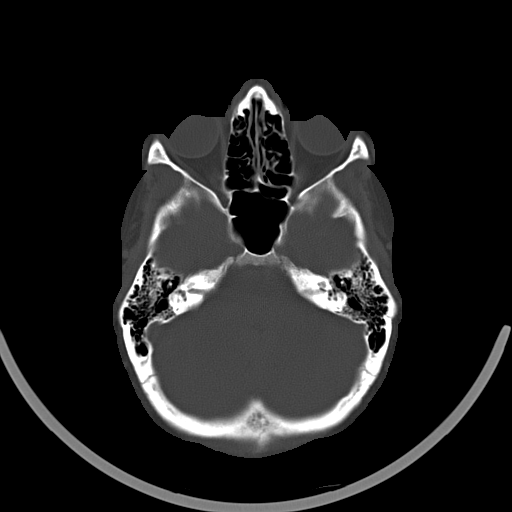
[im 12/33  bone]
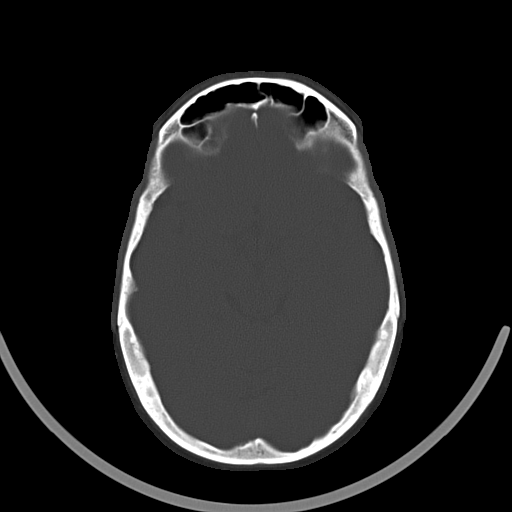

[16 of 30 positions shown; findings below may reference images not displayed]

FINDINGS: The ventricles are normal in size and configuration. There is no
intracranial mass, hemorrhage, extra-axial fluid collection, or
midline shift. The gray-white compartments are normal. No acute
infarct evident. Bony calvarium appears intact. The mastoid air
cells are clear.
IMPRESSION: Study within normal limits.
